# Patient Record
Sex: Male | Born: 1945 | Race: Black or African American | Hispanic: No | Marital: Married | Smoking: Never smoker
Health system: Southern US, Community
[De-identification: ages and names within clinical notes are randomized; demographics above are authoritative.]

## PROBLEM LIST (undated history)

## (undated) DIAGNOSIS — E291 Testicular hypofunction: Secondary | ICD-10-CM

## (undated) DIAGNOSIS — E785 Hyperlipidemia, unspecified: Secondary | ICD-10-CM

## (undated) DIAGNOSIS — G4733 Obstructive sleep apnea (adult) (pediatric): Secondary | ICD-10-CM

## (undated) DIAGNOSIS — Z95818 Presence of other cardiac implants and grafts: Secondary | ICD-10-CM

## (undated) DIAGNOSIS — N529 Male erectile dysfunction, unspecified: Secondary | ICD-10-CM

## (undated) DIAGNOSIS — E790 Hyperuricemia without signs of inflammatory arthritis and tophaceous disease: Secondary | ICD-10-CM

## (undated) DIAGNOSIS — H60549 Acute eczematoid otitis externa, unspecified ear: Secondary | ICD-10-CM

## (undated) DIAGNOSIS — E119 Type 2 diabetes mellitus without complications: Secondary | ICD-10-CM

## (undated) DIAGNOSIS — I10 Essential (primary) hypertension: Secondary | ICD-10-CM

## (undated) DIAGNOSIS — Z9989 Dependence on other enabling machines and devices: Secondary | ICD-10-CM

## (undated) HISTORY — PX: HYPOSPADIAS CORRECTION: SHX483

---

## 2005-04-14 ENCOUNTER — Ambulatory Visit: Payer: Self-pay | Admitting: Gastroenterology

## 2012-05-24 ENCOUNTER — Ambulatory Visit: Payer: Self-pay | Admitting: Internal Medicine

## 2012-06-16 ENCOUNTER — Ambulatory Visit: Payer: Self-pay | Admitting: Internal Medicine

## 2012-07-17 ENCOUNTER — Ambulatory Visit: Payer: Self-pay | Admitting: Internal Medicine

## 2012-08-16 ENCOUNTER — Ambulatory Visit: Payer: Self-pay | Admitting: Internal Medicine

## 2013-09-19 DIAGNOSIS — E1169 Type 2 diabetes mellitus with other specified complication: Secondary | ICD-10-CM | POA: Insufficient documentation

## 2013-09-20 DIAGNOSIS — H60549 Acute eczematoid otitis externa, unspecified ear: Secondary | ICD-10-CM | POA: Insufficient documentation

## 2014-09-22 DIAGNOSIS — Z79899 Other long term (current) drug therapy: Secondary | ICD-10-CM | POA: Insufficient documentation

## 2015-03-30 DIAGNOSIS — Z6830 Body mass index (BMI) 30.0-30.9, adult: Secondary | ICD-10-CM | POA: Insufficient documentation

## 2015-05-29 ENCOUNTER — Encounter: Payer: Self-pay | Admitting: *Deleted

## 2015-06-01 ENCOUNTER — Encounter
Admission: RE | Disposition: A | Discharge status: Home / Self Care | Payer: Self-pay | Source: Ambulatory Visit | Attending: Gastroenterology

## 2015-06-01 ENCOUNTER — Ambulatory Visit: Payer: Medicare Other | Admitting: Anesthesiology

## 2015-06-01 ENCOUNTER — Encounter: Payer: Self-pay | Admitting: Anesthesiology

## 2015-06-01 ENCOUNTER — Ambulatory Visit
Admission: RE | Admit: 2015-06-01 | Discharge: 2015-06-01 | Disposition: A | Discharge status: Home / Self Care | Payer: Medicare Other | Source: Ambulatory Visit | Attending: Gastroenterology | Admitting: Gastroenterology

## 2015-06-01 DIAGNOSIS — I10 Essential (primary) hypertension: Secondary | ICD-10-CM | POA: Insufficient documentation

## 2015-06-01 DIAGNOSIS — K640 First degree hemorrhoids: Secondary | ICD-10-CM | POA: Diagnosis not present

## 2015-06-01 DIAGNOSIS — E119 Type 2 diabetes mellitus without complications: Secondary | ICD-10-CM | POA: Diagnosis not present

## 2015-06-01 DIAGNOSIS — N529 Male erectile dysfunction, unspecified: Secondary | ICD-10-CM | POA: Insufficient documentation

## 2015-06-01 DIAGNOSIS — Z888 Allergy status to other drugs, medicaments and biological substances status: Secondary | ICD-10-CM | POA: Insufficient documentation

## 2015-06-01 DIAGNOSIS — Z7982 Long term (current) use of aspirin: Secondary | ICD-10-CM | POA: Insufficient documentation

## 2015-06-01 DIAGNOSIS — E785 Hyperlipidemia, unspecified: Secondary | ICD-10-CM | POA: Diagnosis not present

## 2015-06-01 DIAGNOSIS — Z79899 Other long term (current) drug therapy: Secondary | ICD-10-CM | POA: Insufficient documentation

## 2015-06-01 DIAGNOSIS — E790 Hyperuricemia without signs of inflammatory arthritis and tophaceous disease: Secondary | ICD-10-CM | POA: Insufficient documentation

## 2015-06-01 DIAGNOSIS — K621 Rectal polyp: Secondary | ICD-10-CM | POA: Diagnosis not present

## 2015-06-01 DIAGNOSIS — G4730 Sleep apnea, unspecified: Secondary | ICD-10-CM | POA: Insufficient documentation

## 2015-06-01 DIAGNOSIS — Z794 Long term (current) use of insulin: Secondary | ICD-10-CM | POA: Insufficient documentation

## 2015-06-01 DIAGNOSIS — G2581 Restless legs syndrome: Secondary | ICD-10-CM | POA: Insufficient documentation

## 2015-06-01 DIAGNOSIS — Z1211 Encounter for screening for malignant neoplasm of colon: Secondary | ICD-10-CM | POA: Insufficient documentation

## 2015-06-01 DIAGNOSIS — D122 Benign neoplasm of ascending colon: Secondary | ICD-10-CM | POA: Insufficient documentation

## 2015-06-01 DIAGNOSIS — G4733 Obstructive sleep apnea (adult) (pediatric): Secondary | ICD-10-CM | POA: Insufficient documentation

## 2015-06-01 DIAGNOSIS — Z7984 Long term (current) use of oral hypoglycemic drugs: Secondary | ICD-10-CM | POA: Insufficient documentation

## 2015-06-01 HISTORY — DX: Hyperuricemia without signs of inflammatory arthritis and tophaceous disease: E79.0

## 2015-06-01 HISTORY — DX: Testicular hypofunction: E29.1

## 2015-06-01 HISTORY — DX: Obstructive sleep apnea (adult) (pediatric): G47.33

## 2015-06-01 HISTORY — DX: Hyperlipidemia, unspecified: E78.5

## 2015-06-01 HISTORY — DX: Dependence on other enabling machines and devices: Z99.89

## 2015-06-01 HISTORY — DX: Male erectile dysfunction, unspecified: N52.9

## 2015-06-01 HISTORY — DX: Essential (primary) hypertension: I10

## 2015-06-01 HISTORY — DX: Type 2 diabetes mellitus without complications: E11.9

## 2015-06-01 HISTORY — PX: COLONOSCOPY WITH PROPOFOL: SHX5780

## 2015-06-01 HISTORY — DX: Acute eczematoid otitis externa, unspecified ear: H60.549

## 2015-06-01 SURGERY — COLONOSCOPY WITH PROPOFOL
Anesthesia: General

## 2015-06-01 MED ORDER — PROPOFOL 10 MG/ML IV BOLUS
INTRAVENOUS | Status: DC | PRN
  Administered 2015-06-01: 30 mg via INTRAVENOUS
  Administered 2015-06-01: 80 mg via INTRAVENOUS

## 2015-06-01 MED ORDER — PROPOFOL 500 MG/50ML IV EMUL
INTRAVENOUS | Status: DC | PRN
  Administered 2015-06-01: 120 ug/kg/min via INTRAVENOUS

## 2015-06-01 MED ORDER — SODIUM CHLORIDE 0.9 % IV SOLN
INTRAVENOUS | Status: DC
  Administered 2015-06-01: 1000 mL via INTRAVENOUS

## 2015-06-01 NOTE — Anesthesia Postprocedure Evaluation (Signed)
Anesthesia Post Note  Patient: Kenneth Conrad  Procedure(s) Performed: Procedure(s) (LRB): COLONOSCOPY WITH PROPOFOL (N/A)  Patient location during evaluation: Endoscopy Anesthesia Type: General Level of consciousness: awake and alert Pain management: pain level controlled Vital Signs Assessment: post-procedure vital signs reviewed and stable Respiratory status: spontaneous breathing, nonlabored ventilation, respiratory function stable and patient connected to nasal cannula oxygen Cardiovascular status: blood pressure returned to baseline and stable Postop Assessment: no signs of nausea or vomiting Anesthetic complications: no    Last Vitals:  Filed Vitals:   06/01/15 1002 06/01/15 1013  BP: 130/73 131/76  Pulse: 57 53  Temp:    Resp: 15 14    Last Pain: There were no vitals filed for this visit.               Kenneth Conrad

## 2015-06-01 NOTE — Discharge Instructions (Signed)

## 2015-06-01 NOTE — Anesthesia Preprocedure Evaluation (Signed)
Anesthesia Evaluation  Patient identified by MRN, date of birth, ID band Patient awake    Reviewed: Allergy & Precautions, H&P , NPO status , Patient's Chart, lab work & pertinent test results  History of Anesthesia Complications Negative for: history of anesthetic complications  Airway Mallampati: II  TM Distance: >3 FB Neck ROM: limited    Dental  (+) Poor Dentition, Chipped   Pulmonary neg shortness of breath, sleep apnea and Continuous Positive Airway Pressure Ventilation ,    Pulmonary exam normal breath sounds clear to auscultation       Cardiovascular Exercise Tolerance: Good hypertension, (-) angina(-) Past MI and (-) DOE Normal cardiovascular exam Rhythm:regular Rate:Normal     Neuro/Psych negative neurological ROS  negative psych ROS   GI/Hepatic negative GI ROS, Neg liver ROS, neg GERD  ,  Endo/Other  diabetes, Type 2, Oral Hypoglycemic Agents  Renal/GU negative Renal ROS  negative genitourinary   Musculoskeletal   Abdominal   Peds  Hematology negative hematology ROS (+)   Anesthesia Other Findings Past Medical History:   Diabetes mellitus without complication (HCC)                 Eczema of external ear                                       ED (erectile dysfunction)                                    Hypertension                                                 Hyperlipidemia                                               Hyperuricemia                                                Hypogonadism in male                                         OSA on CPAP                                                 Past Surgical History:   HYPOSPADIAS CORRECTION                                       BMI    Body Mass Index   30.06 kg/m 2      Reproductive/Obstetrics negative OB ROS  Anesthesia Physical Anesthesia Plan  ASA: III  Anesthesia Plan: General    Post-op Pain Management:    Induction:   Airway Management Planned:   Additional Equipment:   Intra-op Plan:   Post-operative Plan:   Informed Consent: I have reviewed the patients History and Physical, chart, labs and discussed the procedure including the risks, benefits and alternatives for the proposed anesthesia with the patient or authorized representative who has indicated his/her understanding and acceptance.   Dental Advisory Given  Plan Discussed with: Anesthesiologist, CRNA and Surgeon  Anesthesia Plan Comments:         Anesthesia Quick Evaluation

## 2015-06-01 NOTE — Op Note (Signed)
[facility] Gastroenterology Patient Name: Kenneth Conrad Procedure Date: 06/01/2015 9:10 AM MRN: [id number] Account #: [id number] Date of Birth: 1945-07-13 Admit Type: Outpatient Age: 70 Room: [facility] ENDO ROOM 2 Gender: Male Note Status: Finalized Procedure:         Colonoscopy Indications:       Screening for colorectal malignant neoplasm, Last                     colonoscopy 10 years ago Patient Profile:   This is a 70 year old male. Providers:         Gerrit Heck. Rayann Heman, MD Referring MD:      Christena Flake. Raechel Ache, MD (Referring MD) Medicines:         Propofol per Anesthesia Complications:     No immediate complications. Procedure:         Pre-Anesthesia Assessment:                    - Prior to the procedure, a History and Physical was                     performed, and patient medications, allergies and                     sensitivities were reviewed. The patient's tolerance of                     previous anesthesia was reviewed.                    After obtaining informed consent, the colonoscope was                     passed under direct vision. Throughout the procedure, the                     patient's blood pressure, pulse, and oxygen saturations                     were monitored continuously. The Colonoscope was                     introduced through the anus and advanced to the the cecum,                     identified by appendiceal orifice and ileocecal valve. The                     colonoscopy was performed without difficulty. The patient                     tolerated the procedure well. The quality of the bowel                     preparation was excellent. Findings:      The perianal and digital rectal examinations were normal.      A 5 mm polyp was found in the ascending colon. The polyp was sessile.       The polyp was removed with a cold snare. Resection and retrieval were       complete.      A 4 mm polyp was found in the hepatic flexure. The polyp  was sessile.       The polyp was removed with a cold snare. Resection and retrieval were  complete.      A 2 mm polyp was found in the rectum. The polyp was sessile. The polyp       was removed with a jumbo cold forceps. Resection and retrieval were       complete.      Internal hemorrhoids were found during retroflexion. The hemorrhoids       were Grade I (internal hemorrhoids that do not prolapse). Impression:        - One 5 mm polyp in the ascending colon, removed with a                     cold snare. Resected and retrieved.                    - One 4 mm polyp at the hepatic flexure, removed with a                     cold snare. Resected and retrieved.                    - One 2 mm polyp in the rectum, removed with a jumbo cold                     forceps. Resected and retrieved.                    - Internal hemorrhoids. Recommendation:    - Observe patient in GI recovery unit.                    - High fiber diet.                    - Continue present medications.                    - Await pathology results.                    - Repeat colonoscopy for surveillance based on pathology                     results.                    - Return to referring physician.                    - The findings and recommendations were discussed with the                     patient.                    - The findings and recommendations were discussed with the                     patient's family. Procedure Code(s): --- Professional ---                    [id number], Colonoscopy, flexible; with removal of tumor(s),                     polyp(s), or other lesion(s) by snare technique                    [id number], 30, Colonoscopy, flexible; with biopsy, single or  multiple Diagnosis Code(s): --- Professional ---                    Z12.11, Encounter for screening for malignant neoplasm of                     colon                    D12.2, Benign neoplasm of ascending colon                     D12.3, Benign neoplasm of transverse colon (hepatic                     flexure or splenic flexure)                    K62.1, Rectal polyp                    K64.0, First degree hemorrhoids CPT copyright 2016 [employer]. All rights reserved. The codes documented in this report are preliminary and upon coder review may  be revised to meet current compliance requirements. Mellody Life, MD 06/01/2015 9:43:46 AM This report has been signed electronically. Number of Addenda: 0 Note Initiated On: 06/01/2015 9:10 AM Scope Withdrawal Time: 0 hours 17 minutes 59 seconds  Total Procedure Duration: 0 hours 21 minutes 8 seconds       [facility]

## 2015-06-01 NOTE — Transfer of Care (Signed)
Immediate Anesthesia Transfer of Care Note  Patient: Kenneth Conrad  Procedure(s) Performed: Procedure(s): COLONOSCOPY WITH PROPOFOL (N/A)  Patient Location: Endoscopy Unit  Anesthesia Type:General  Level of Consciousness: sedated  Airway & Oxygen Therapy: Patient Spontanous Breathing and Patient connected to nasal cannula oxygen  Post-op Assessment: Report given to RN and Post -op Vital signs reviewed and stable  Post vital signs: Reviewed and stable  Last Vitals:  Filed Vitals:   06/01/15 0854  BP: 153/80  Pulse: 62  Temp: 37 C  Resp: 16    Complications: No apparent anesthesia complications

## 2015-06-01 NOTE — H&P (Signed)
  Primary Care Physician:  Ezequiel Kayser, MD  Pre-Procedure History & Physical: HPI:  Kenneth Conrad is a 70 y.o. male is here for an colonoscopy.   Past Medical History  Diagnosis Date  . Diabetes mellitus without complication ([place])   . Eczema of external ear   . ED (erectile dysfunction)   . Hypertension   . Hyperlipidemia   . Hyperuricemia   . Hypogonadism in male   . OSA on CPAP     Past Surgical History  Procedure Laterality Date  . Hypospadias correction      Prior to Admission medications   Medication Sig Start Date End Date Taking? Authorizing Provider  amLODipine (NORVASC) 2.5 MG tablet Take 2.5 mg by mouth daily.   Yes Historical Provider, MD  aspirin 81 MG tablet Take 81 mg by mouth daily.   Yes Historical Provider, MD  ergocalciferol (VITAMIN D2) 50000 units capsule Take 50,000 Units by mouth every 30 (thirty) days.   Yes Historical Provider, MD  losartan (COZAAR) 100 MG tablet Take 100 mg by mouth daily.   Yes Historical Provider, MD  metFORMIN (GLUCOPHAGE) 500 MG tablet Take by mouth 2 (two) times daily with a meal.   Yes Historical Provider, MD  triamcinolone cream (KENALOG) 0.5 % Apply 1 application topically 2 (two) times daily as needed.   Yes Historical Provider, MD    Allergies as of 05/29/2015 - Review Complete 05/29/2015  Allergen Reaction Noted  . Hctz [hydrochlorothiazide]  05/29/2015  . Hydralazine  05/29/2015  . Reserpine  05/29/2015    History reviewed. No pertinent family history.  Social History   Social History  . Marital Status: Married    Spouse Name: N/A  . Number of Children: N/A  . Years of Education: N/A   Occupational History  . Not on file.   Social History Main Topics  . Smoking status: Not on file  . Smokeless tobacco: Not on file  . Alcohol Use: Not on file  . Drug Use: Not on file  . Sexual Activity: Not on file   Other Topics Concern  . Not on file   Social History Narrative     Physical Exam: BP 153/80 mmHg   Pulse 62  Temp(Src) 98.6 F (37 C) (Tympanic)  Resp 16  Ht 5\' 7"  (1.702 m)  Wt 87.091 kg (192 lb)  BMI 30.06 kg/m2  SpO2 100% General:   Alert,  pleasant and cooperative in NAD Head:  Normocephalic and atraumatic. Neck:  Supple; no masses or thyromegaly. Lungs:  Clear throughout to auscultation.    Heart:  Regular rate and rhythm. Abdomen:  Soft, nontender and nondistended. Normal bowel sounds, without guarding, and without rebound.   Neurologic:  Alert and  oriented x4;  grossly normal neurologically.  Impression/Plan: [name] is here for an colonoscopy to be performed for screening  Risks, benefits, limitations, and alternatives regarding  colonoscopy have been reviewed with the patient.  Questions have been answered.  All parties agreeable.   Josefine Class, MD  06/01/2015, 9:09 AM

## 2015-06-02 LAB — SURGICAL PATHOLOGY

## 2015-06-03 ENCOUNTER — Encounter: Payer: Self-pay | Admitting: Gastroenterology

## 2015-09-23 DIAGNOSIS — Z860101 Personal history of adenomatous and serrated colon polyps: Secondary | ICD-10-CM | POA: Insufficient documentation

## 2016-03-18 DIAGNOSIS — Z91018 Allergy to other foods: Secondary | ICD-10-CM | POA: Insufficient documentation

## 2017-10-02 DIAGNOSIS — G2581 Restless legs syndrome: Secondary | ICD-10-CM | POA: Insufficient documentation

## 2017-10-05 ENCOUNTER — Other Ambulatory Visit: Payer: Self-pay | Admitting: Internal Medicine

## 2017-10-05 DIAGNOSIS — M5412 Radiculopathy, cervical region: Secondary | ICD-10-CM

## 2017-10-11 ENCOUNTER — Ambulatory Visit
Admission: RE | Admit: 2017-10-11 | Discharge: 2017-10-11 | Disposition: A | Discharge status: Home / Self Care | Payer: Medicare Other | Source: Ambulatory Visit | Attending: Internal Medicine | Admitting: Internal Medicine

## 2017-10-11 DIAGNOSIS — M2578 Osteophyte, vertebrae: Secondary | ICD-10-CM | POA: Insufficient documentation

## 2017-10-11 DIAGNOSIS — M4722 Other spondylosis with radiculopathy, cervical region: Secondary | ICD-10-CM | POA: Insufficient documentation

## 2017-10-11 DIAGNOSIS — M5412 Radiculopathy, cervical region: Secondary | ICD-10-CM | POA: Diagnosis present

## 2017-10-11 DIAGNOSIS — M4802 Spinal stenosis, cervical region: Secondary | ICD-10-CM | POA: Diagnosis not present

## 2018-10-15 DIAGNOSIS — E538 Deficiency of other specified B group vitamins: Secondary | ICD-10-CM | POA: Insufficient documentation

## 2019-06-12 ENCOUNTER — Ambulatory Visit: Payer: Medicare Other | Attending: Internal Medicine

## 2019-06-12 DIAGNOSIS — Z20822 Contact with and (suspected) exposure to covid-19: Secondary | ICD-10-CM

## 2019-06-13 LAB — NOVEL CORONAVIRUS, NAA: SARS-CoV-2, NAA: NOT DETECTED

## 2019-07-29 IMAGING — MR MR CERVICAL SPINE W/O CM
5 series · 32 of 48 positions shown · non-contrast
Comparison: None.

CLINICAL DATA: Initial evaluation for left shoulder pain with
tingling in left arm for several months.

EXAM:
MRI CERVICAL SPINE WITHOUT CONTRAST
TECHNIQUE: Multiplanar, multisequence MR imaging of the cervical spine was
performed. No intravenous contrast was administered.

[Series 2: T2 · sagittal · 3.0mm · 0.56mm/px · 6 of 13 slices shown (1 of 2)]
[im 1/13]
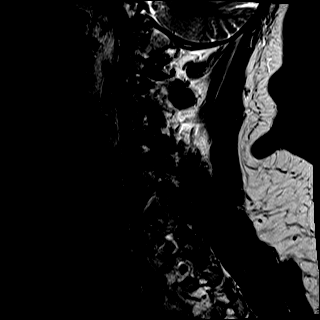
[im 3/13]
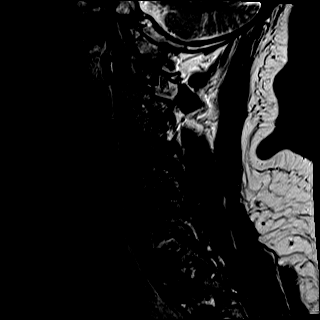
[im 5/13]
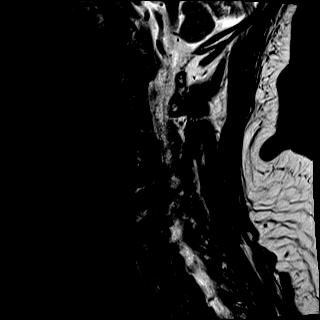
[im 8/13]
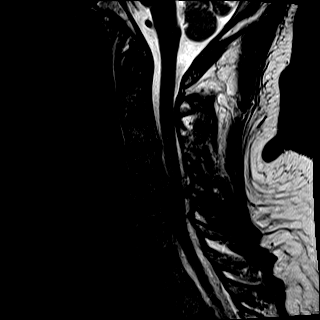
[im 10/13]
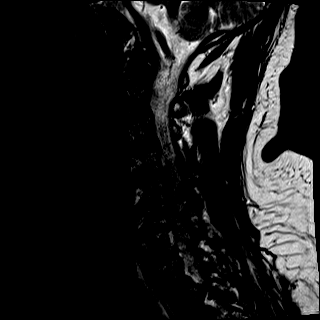
[im 13/13]
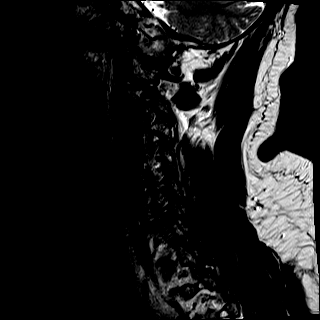

[Series 3: T1 · sagittal · 3.0mm · 0.70mm/px · 7 of 13 slices shown]
[im 1/13]
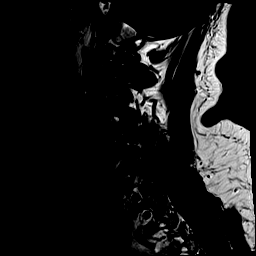
[im 3/13]
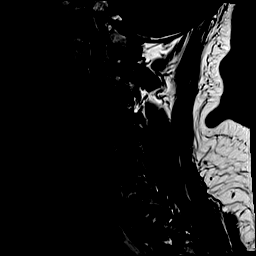
[im 5/13]
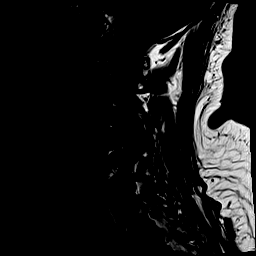
[im 7/13]
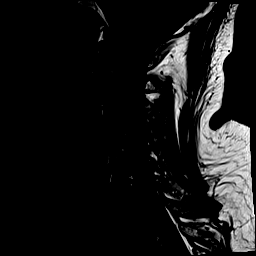
[im 9/13]
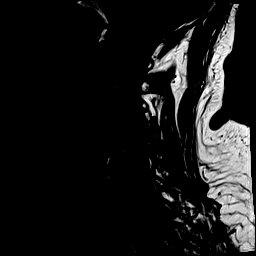
[im 11/13]
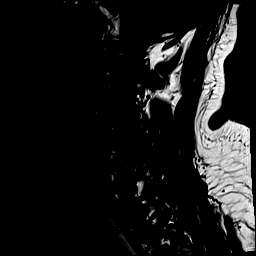
[im 13/13]
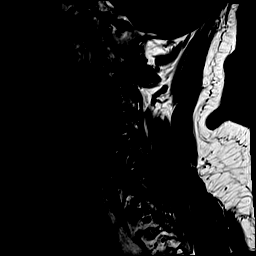

[Series 4: STIR · sagittal · 3.0mm · 0.35mm/px · 7 of 13 slices shown]
[im 1/13]
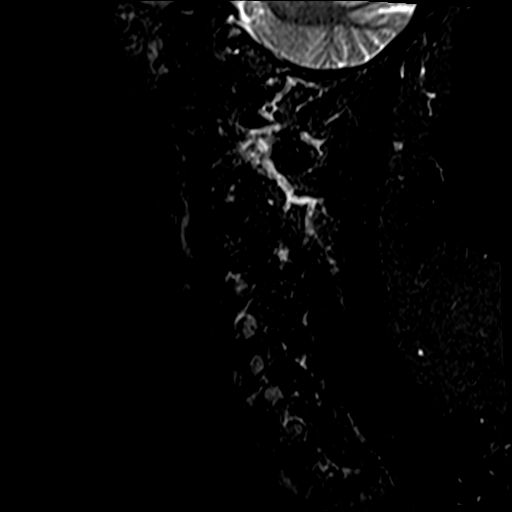
[im 3/13]
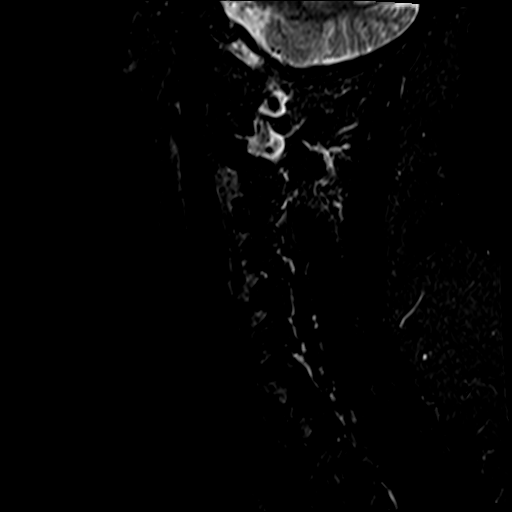
[im 5/13]
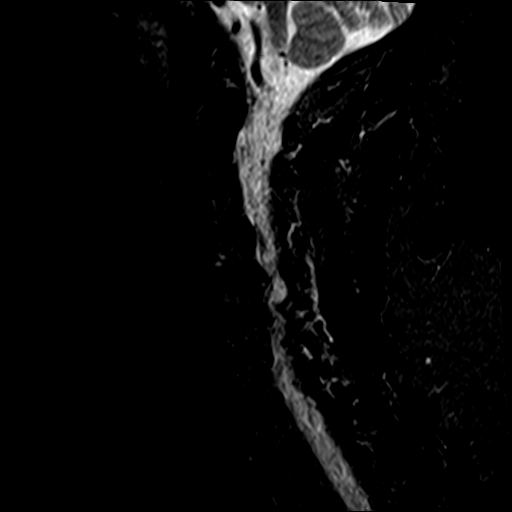
[im 7/13]
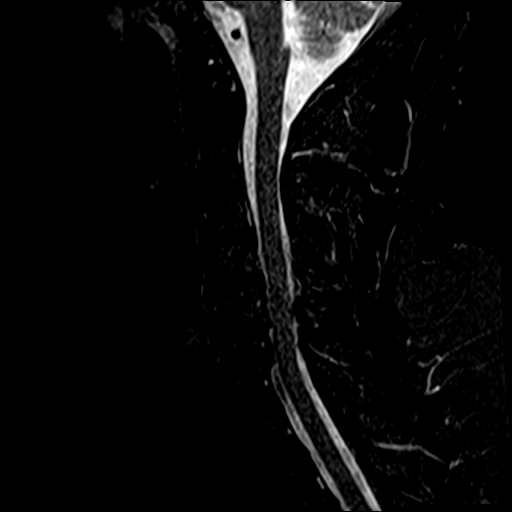
[im 9/13]
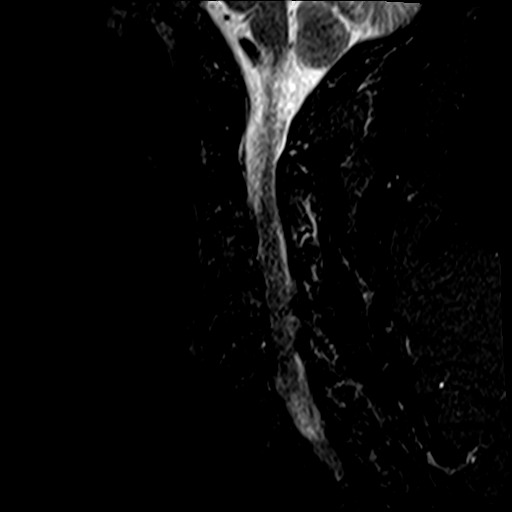
[im 11/13]
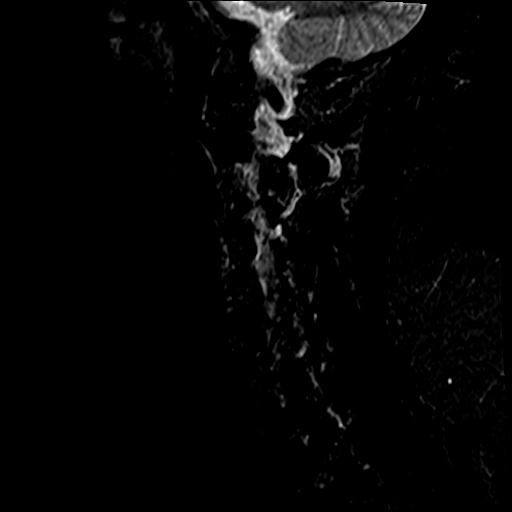
[im 13/13]
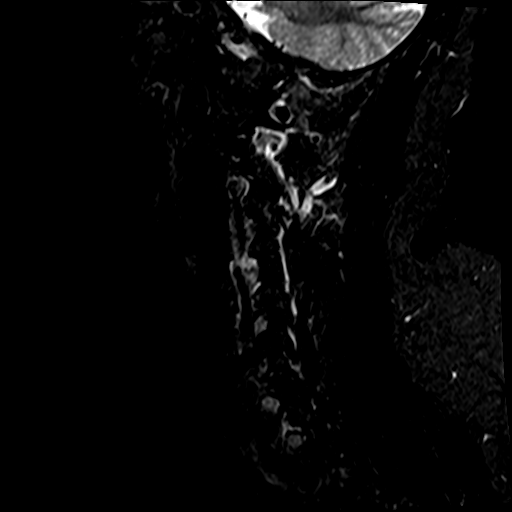

[Series 5: T2 · axial · 3.0mm · 0.62mm/px · z∈[-61,+31]mm · 8 of 26 slices shown (2 of 2)]
[im 1/26]
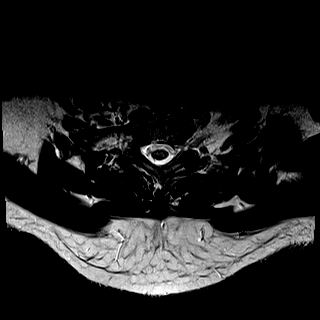
[im 4/26]
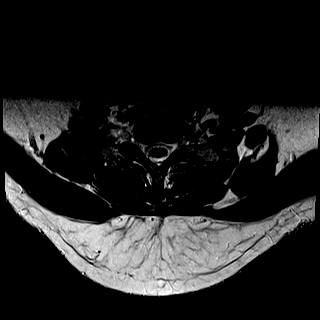
[im 8/26]
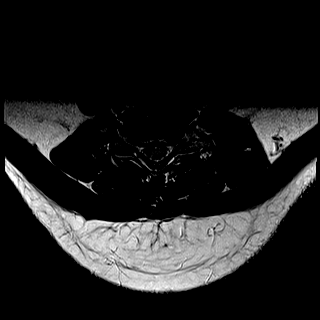
[im 12/26]
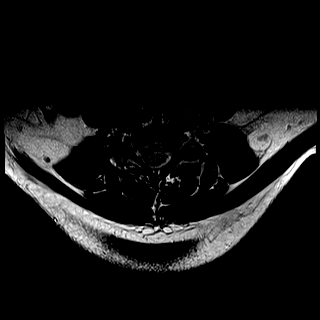
[im 14/26]
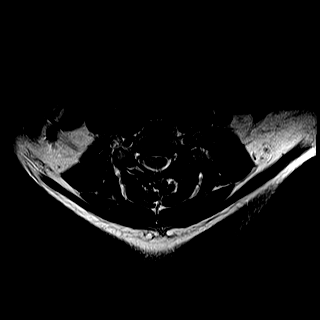
[im 18/26]
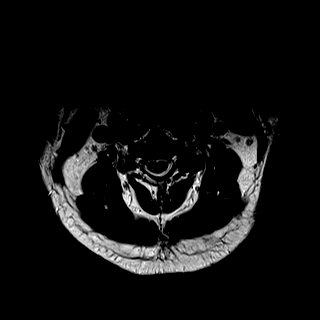
[im 22/26]
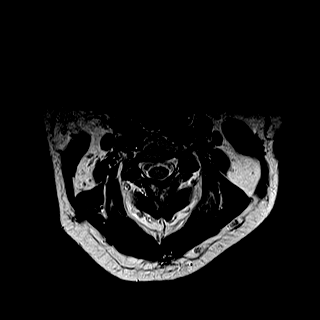
[im 26/26]
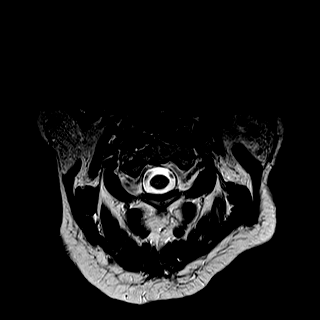

[Series 6: mpgr ax · axial · 3.0mm · 0.35mm/px · z∈[-51,-10]mm · 4 of 26 slices shown]
[im 1/26]
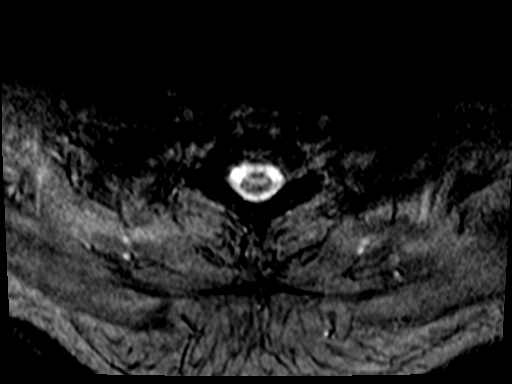
[im 4/26]
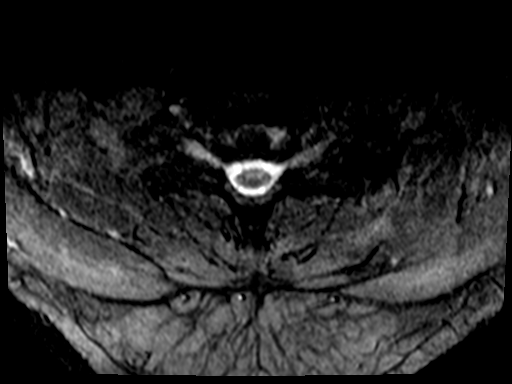
[im 8/26]
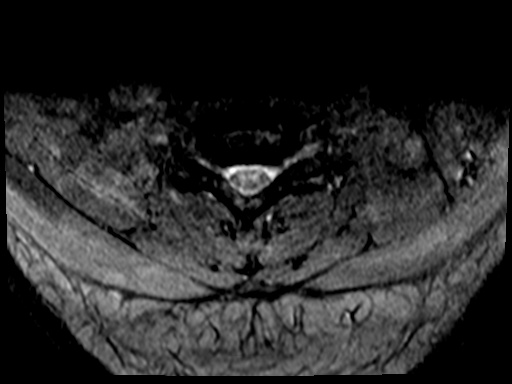
[im 12/26]
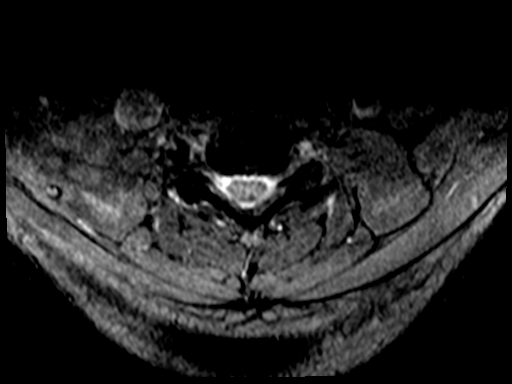

[32 of 48 positions shown; findings below may reference images not displayed]

FINDINGS: Alignment: Straightening with slight reversal of the normal cervical
lordosis. Trace anterolisthesis of C3 on C4 and C4 on C5.

Vertebrae: Vertebral body heights maintained without evidence for
acute or chronic fracture. Bone marrow signal intensity within
normal limits. No discrete or worrisome osseous lesions. No abnormal
marrow edema.

Cord: Signal intensity within the cervical spinal cord is normal.

Posterior Fossa, vertebral arteries, paraspinal tissues: Visualized
brain and posterior fossa within normal limits. Craniocervical
junction normal. Paraspinous and prevertebral soft tissues are
normal. Normal intravascular flow voids present within the vertebral
arteries bilaterally.

Disc levels:

C2-C3: Unremarkable.

C3-C4: Trace anterolisthesis. Mild diffuse disc bulge with prominent
left-sided facet degeneration. Resultant moderate to severe left C4
foraminal stenosis. Central canal remains widely patent.

C4-C5: Trace anterolisthesis. Diffuse disc bulge, asymmetric to the
left. Prominent left-sided facet degeneration. Resultant moderate to
severe left C5 foraminal stenosis. Central canal remains patent.

C5-C6: Chronic diffuse degenerative disc osteophyte with left
greater than right uncovertebral spurring. Flattening of the ventral
CSF with mild spinal stenosis. Moderate to severe left foraminal
narrowing related uncovertebral disease.

C6-C7: Mild diffuse disc bulge with uncovertebral hypertrophy.
Flattening of the ventral CSF with mild spinal stenosis. Mild to
moderate bilateral C7 foraminal narrowing.

C7-T1: Mild disc bulge. Left-sided facet hypertrophy. No stenosis.

Visualized upper thoracic spine demonstrates no significant finding.
IMPRESSION: 1. Degenerative spondylolysis with left-sided facet hypertrophy at
C3-4 through C5-6 with resultant moderate left foraminal narrowing.
The left C4, C5, or C6 nerve roots could be affected.
2. Degenerative disc osteophyte at C5-6 and C6-7 with resultant mild
spinal stenosis.

## 2020-09-21 ENCOUNTER — Encounter: Payer: Self-pay | Admitting: *Deleted

## 2020-09-22 ENCOUNTER — Ambulatory Visit: Payer: Medicare Other | Admitting: Certified Registered Nurse Anesthetist

## 2020-09-22 ENCOUNTER — Ambulatory Visit
Admission: RE | Admit: 2020-09-22 | Discharge: 2020-09-22 | Disposition: A | Discharge status: Home / Self Care | Payer: Medicare Other | Source: Ambulatory Visit | Attending: Gastroenterology | Admitting: Gastroenterology

## 2020-09-22 ENCOUNTER — Encounter
Admission: RE | Disposition: A | Discharge status: Home / Self Care | Payer: Self-pay | Source: Ambulatory Visit | Attending: Gastroenterology

## 2020-09-22 ENCOUNTER — Encounter: Payer: Self-pay | Admitting: *Deleted

## 2020-09-22 DIAGNOSIS — Z888 Allergy status to other drugs, medicaments and biological substances status: Secondary | ICD-10-CM | POA: Diagnosis not present

## 2020-09-22 DIAGNOSIS — D123 Benign neoplasm of transverse colon: Secondary | ICD-10-CM | POA: Insufficient documentation

## 2020-09-22 DIAGNOSIS — Z7984 Long term (current) use of oral hypoglycemic drugs: Secondary | ICD-10-CM | POA: Diagnosis not present

## 2020-09-22 DIAGNOSIS — Z79899 Other long term (current) drug therapy: Secondary | ICD-10-CM | POA: Insufficient documentation

## 2020-09-22 DIAGNOSIS — K640 First degree hemorrhoids: Secondary | ICD-10-CM | POA: Insufficient documentation

## 2020-09-22 DIAGNOSIS — Z1211 Encounter for screening for malignant neoplasm of colon: Secondary | ICD-10-CM | POA: Diagnosis not present

## 2020-09-22 DIAGNOSIS — I10 Essential (primary) hypertension: Secondary | ICD-10-CM | POA: Diagnosis not present

## 2020-09-22 DIAGNOSIS — Z86010 Personal history of colonic polyps: Secondary | ICD-10-CM | POA: Diagnosis present

## 2020-09-22 DIAGNOSIS — D125 Benign neoplasm of sigmoid colon: Secondary | ICD-10-CM | POA: Diagnosis not present

## 2020-09-22 DIAGNOSIS — E119 Type 2 diabetes mellitus without complications: Secondary | ICD-10-CM | POA: Diagnosis not present

## 2020-09-22 DIAGNOSIS — Z7982 Long term (current) use of aspirin: Secondary | ICD-10-CM | POA: Insufficient documentation

## 2020-09-22 HISTORY — PX: COLONOSCOPY WITH PROPOFOL: SHX5780

## 2020-09-22 LAB — GLUCOSE, CAPILLARY: Glucose-Capillary: 126 mg/dL — ABNORMAL HIGH (ref 70–99)

## 2020-09-22 SURGERY — COLONOSCOPY WITH PROPOFOL
Anesthesia: General

## 2020-09-22 MED ORDER — SODIUM CHLORIDE 0.9 % IV SOLN
INTRAVENOUS | Status: DC
  Administered 2020-09-22: 1000 mL via INTRAVENOUS

## 2020-09-22 MED ORDER — PROPOFOL 500 MG/50ML IV EMUL
INTRAVENOUS | Status: DC | PRN
  Administered 2020-09-22: 125 ug/kg/min via INTRAVENOUS

## 2020-09-22 MED ORDER — SODIUM CHLORIDE 0.9 % IV SOLN: INTRAVENOUS | Status: DC | PRN

## 2020-09-22 MED ORDER — PROPOFOL 10 MG/ML IV BOLUS
INTRAVENOUS | Status: DC | PRN
  Administered 2020-09-22: 40 mg via INTRAVENOUS
  Administered 2020-09-22: 20 mg via INTRAVENOUS

## 2020-09-22 MED ORDER — LIDOCAINE HCL (CARDIAC) PF 100 MG/5ML IV SOSY
PREFILLED_SYRINGE | INTRAVENOUS | Status: DC | PRN
  Administered 2020-09-22: 40 mg via INTRAVENOUS

## 2020-09-22 NOTE — Anesthesia Postprocedure Evaluation (Signed)
Anesthesia Post Note  Patient: Kenneth Conrad  Procedure(s) Performed: COLONOSCOPY WITH PROPOFOL (N/A )  Patient location during evaluation: Endoscopy Anesthesia Type: General Level of consciousness: awake and alert Pain management: pain level controlled Vital Signs Assessment: post-procedure vital signs reviewed and stable Respiratory status: spontaneous breathing, nonlabored ventilation, respiratory function stable and patient connected to nasal cannula oxygen Cardiovascular status: blood pressure returned to baseline and stable Postop Assessment: no apparent nausea or vomiting Anesthetic complications: no   No complications documented.   Last Vitals:  Vitals:   09/22/20 0959 09/22/20 1009  BP: (!) 144/74 (!) 153/70  Pulse:    Resp:    Temp:    SpO2:      Last Pain:  Vitals:   09/22/20 1009  TempSrc:   PainSc: 0-No pain                 Arita Miss

## 2020-09-22 NOTE — Anesthesia Preprocedure Evaluation (Signed)
Anesthesia Evaluation  Patient identified by MRN, date of birth, ID band Patient awake    Reviewed: Allergy & Precautions, NPO status , Patient's Chart, lab work & pertinent test results  History of Anesthesia Complications Negative for: history of anesthetic complications  Airway Mallampati: II  TM Distance: >3 FB Neck ROM: Full    Dental no notable dental hx. (+) Teeth Intact   Pulmonary sleep apnea and Continuous Positive Airway Pressure Ventilation , neg COPD, Patient abstained from smoking.Not current smoker,    Pulmonary exam normal breath sounds clear to auscultation       Cardiovascular Exercise Tolerance: Good METShypertension, Pt. on medications (-) CAD and (-) Past MI (-) dysrhythmias  Rhythm:Regular Rate:Normal - Systolic murmurs    Neuro/Psych negative neurological ROS  negative psych ROS   GI/Hepatic neg GERD  ,(+)     (-) substance abuse  ,   Endo/Other  diabetes  Renal/GU negative Renal ROS     Musculoskeletal   Abdominal   Peds  Hematology   Anesthesia Other Findings Past Medical History: No date: Diabetes mellitus without complication (HCC) No date: Eczema of external ear No date: ED (erectile dysfunction) No date: Hyperlipidemia No date: Hypertension No date: Hyperuricemia No date: Hypogonadism in male No date: OSA on CPAP  Reproductive/Obstetrics                             Anesthesia Physical Anesthesia Plan  ASA: II  Anesthesia Plan: General   Post-op Pain Management:    Induction: Intravenous  PONV Risk Score and Plan: 2 and Ondansetron, Propofol infusion and TIVA  Airway Management Planned: Nasal Cannula  Additional Equipment: None  Intra-op Plan:   Post-operative Plan:   Informed Consent: I have reviewed the patients History and Physical, chart, labs and discussed the procedure including the risks, benefits and alternatives for the proposed  anesthesia with the patient or authorized representative who has indicated his/her understanding and acceptance.     Dental advisory given  Plan Discussed with: CRNA and Surgeon  Anesthesia Plan Comments: (Discussed risks of anesthesia with patient, including possibility of difficulty with spontaneous ventilation under anesthesia necessitating airway intervention, PONV, and rare risks such as cardiac or respiratory or neurological events. Patient understands.)        Anesthesia Quick Evaluation

## 2020-09-22 NOTE — H&P (Signed)
Outpatient short stay form Pre-procedure 09/22/2020 9:07 AM Raylene Miyamoto MD, MPH  Primary Physician: Dr. Raechel Ache  Reason for visit:  Surveillance  History of present illness:   75 y/o gentleman with history of hypertension, DM II, and obesity here for surveillance colonoscopy. No family history of GI malignancies. No blood thinners. No abdominal surgeries. Had two small polyps on colonoscopy done in 2017 that were SSA.    Current Facility-Administered Medications:  .  0.9 %  sodium chloride infusion, , Intravenous, Continuous, Greysen Devino, Hilton Cork, MD, Last Rate: 20 mL/hr at 09/22/20 0902, 1,000 mL at 09/22/20 0902  Medications Prior to Admission  Medication Sig Dispense Refill Last Dose  . amLODipine (NORVASC) 2.5 MG tablet Take 10 mg by mouth daily.   09/21/2020 at Unknown time  . aspirin 81 MG tablet Take 81 mg by mouth daily.   Past Week at Unknown time  . ergocalciferol (VITAMIN D2) 50000 units capsule Take 50,000 Units by mouth every 30 (thirty) days.   Past Week at Unknown time  . losartan (COZAAR) 100 MG tablet Take 100 mg by mouth daily.   09/21/2020 at Unknown time  . metFORMIN (GLUCOPHAGE) 500 MG tablet Take by mouth 2 (two) times daily with a meal.   Past Week at Unknown time  . pravastatin (PRAVACHOL) 20 MG tablet Take 20 mg by mouth daily.   09/21/2020 at Unknown time  . rOPINIRole (REQUIP) 0.5 MG tablet Take 0.5 mg by mouth 3 (three) times daily.   09/21/2020 at Unknown time  . triamcinolone cream (KENALOG) 0.5 % Apply 1 application topically 2 (two) times daily as needed.   Past Week at Unknown time  . vitamin B-12 (CYANOCOBALAMIN) 1000 MCG tablet Take 1,000 mcg by mouth daily.   Past Week at Unknown time     Allergies  Allergen Reactions  . Hctz [Hydrochlorothiazide]   . Hydralazine   . Reserpine      Past Medical History:  Diagnosis Date  . Diabetes mellitus without complication ([place])   . Eczema of external ear   . ED (erectile dysfunction)   . Hyperlipidemia   .  Hypertension   . Hyperuricemia   . Hypogonadism in male   . OSA on CPAP     Review of systems:  Otherwise negative.    Physical Exam  Gen: Alert, oriented. Appears stated age.  HEENT: PERRLA. Lungs: No respiratory distress CV: RRR Abd: soft, benign, no masses Ext: No edema    Planned procedures: Proceed with colonoscopy. The patient understands the nature of the planned procedure, indications, risks, alternatives and potential complications including but not limited to bleeding, infection, perforation, damage to internal organs and possible oversedation/side effects from anesthesia. The patient agrees and gives consent to proceed.  Please refer to procedure notes for findings, recommendations and patient disposition/instructions.     Raylene Miyamoto MD, MPH Gastroenterology 09/22/2020  9:07 AM

## 2020-09-22 NOTE — Transfer of Care (Signed)
Immediate Anesthesia Transfer of Care Note  Patient: Kenneth Conrad  Procedure(s) Performed: COLONOSCOPY WITH PROPOFOL (N/A )  Patient Location: PACU  Anesthesia Type:General  Level of Consciousness: awake, alert  and oriented  Airway & Oxygen Therapy: Patient Spontanous Breathing  Post-op Assessment: Report given to RN and Post -op Vital signs reviewed and stable  Post vital signs: Reviewed and stable  Last Vitals:  Vitals Value Taken Time  BP    Temp    Pulse    Resp    SpO2      Last Pain:  Vitals:   09/22/20 0848  TempSrc: Temporal  PainSc: 0-No pain         Complications: No complications documented.

## 2020-09-22 NOTE — Op Note (Signed)
[facility] Gastroenterology Patient Name: Kenneth Conrad Procedure Date: 09/22/2020 9:00 AM MRN: [id number] Account #: [id number] Date of Birth: March 18, 1946 Admit Type: Outpatient Age: 75 Room: [facility] ENDO ROOM 2 Gender: Male Note Status: Finalized Procedure:             Colonoscopy Indications:           High risk colon cancer surveillance: Personal history                         of sessile serrated colon polyp (less than 10 mm in                         size) with no dysplasia Providers:             Andrey Farmer MD, MD Referring MD:          Christena Flake. Raechel Ache, MD (Referring MD) Medicines:             Monitored Anesthesia Care Complications:         No immediate complications. Estimated blood loss:                         Minimal. Procedure:             Pre-Anesthesia Assessment:                        - Prior to the procedure, a History and Physical was                         performed, and patient medications and allergies were                         reviewed. The patient is competent. The risks and                         benefits of the procedure and the sedation options and                         risks were discussed with the patient. All questions                         were answered and informed consent was obtained.                         Patient identification and proposed procedure were                         verified by the physician, the nurse, the anesthetist                         and the technician in the endoscopy suite. Mental                         Status Examination: alert and oriented. Airway                         Examination: normal oropharyngeal airway and neck  mobility. Respiratory Examination: clear to                         auscultation. CV Examination: normal. Prophylactic                         Antibiotics: The patient does not require prophylactic                         antibiotics. Prior  Anticoagulants: The patient has                         taken no previous anticoagulant or antiplatelet                         agents. ASA Grade Assessment: II - A patient with mild                         systemic disease. After reviewing the risks and                         benefits, the patient was deemed in satisfactory                         condition to undergo the procedure. The anesthesia                         plan was to use monitored anesthesia care (MAC).                         Immediately prior to administration of medications,                         the patient was re-assessed for adequacy to receive                         sedatives. The heart rate, respiratory rate, oxygen                         saturations, blood pressure, adequacy of pulmonary                         ventilation, and response to care were monitored                         throughout the procedure. The physical status of the                         patient was re-assessed after the procedure.                        After obtaining informed consent, the colonoscope was                         passed under direct vision. Throughout the procedure,                         the patient's blood pressure, pulse, and oxygen  saturations were monitored continuously. The                         Colonoscope was introduced through the anus and                         advanced to the the cecum, identified by appendiceal                         orifice and ileocecal valve. The colonoscopy was                         performed without difficulty. The patient tolerated                         the procedure well. The quality of the bowel                         preparation was good. Findings:      The perianal and digital rectal examinations were normal.      A 3 mm polyp was found in the proximal transverse colon. The polyp was       sessile. The polyp was removed with a cold snare. Resection  and       retrieval were complete. Estimated blood loss was minimal.      A 4 mm polyp was found in the distal transverse colon. The polyp was       sessile. The polyp was removed with a cold snare. Resection and       retrieval were complete. Estimated blood loss was minimal.      A 2 mm polyp was found in the sigmoid colon. The polyp was sessile. The       polyp was removed with a cold snare. Resection and retrieval were       complete. Estimated blood loss was minimal.      Internal hemorrhoids were found during retroflexion. The hemorrhoids       were Grade I (internal hemorrhoids that do not prolapse).      The exam was otherwise without abnormality on direct and retroflexion       views. Impression:            - One 3 mm polyp in the proximal transverse colon,                         removed with a cold snare. Resected and retrieved.                        - One 4 mm polyp in the distal transverse colon,                         removed with a cold snare. Resected and retrieved.                        - One 2 mm polyp in the sigmoid colon, removed with a                         cold snare. Resected and retrieved.                        -  Internal hemorrhoids.                        - The examination was otherwise normal on direct and                         retroflexion views. Recommendation:        - Discharge patient to home.                        - Resume previous diet.                        - Continue present medications.                        - Await pathology results.                        - Repeat colonoscopy for surveillance based on                         pathology results.                        - Return to referring physician as previously                         scheduled. Procedure Code(s):     --- Professional ---                        [phone], Colonoscopy, flexible; with removal of                         tumor(s), polyp(s), or other lesion(s) by snare                          technique Diagnosis Code(s):     --- Professional ---                        Z86.010, Personal history of colonic polyps                        K63.5, Polyp of colon                        K64.0, First degree hemorrhoids CPT copyright 2019 [employer]. All rights reserved. The codes documented in this report are preliminary and upon coder review may  be revised to meet current compliance requirements. Andrey Farmer MD, MD 09/22/2020 9:38:25 AM Number of Addenda: 0 Note Initiated On: 09/22/2020 9:00 AM Scope Withdrawal Time: 0 hours 13 minutes 22 seconds  Total Procedure Duration: 0 hours 18 minutes 27 seconds  Estimated Blood Loss:  Estimated blood loss was minimal.      [facility]

## 2020-09-22 NOTE — Interval H&P Note (Signed)
History and Physical Interval Note:  09/22/2020 9:09 AM  Kenneth Conrad  has presented today for surgery, with the diagnosis of PERSONAL HX.OF COLON POLYPS.  The various methods of treatment have been discussed with the patient and family. After consideration of risks, benefits and other options for treatment, the patient has consented to  Procedure(s): COLONOSCOPY WITH PROPOFOL (N/A) as a surgical intervention.  The patient's history has been reviewed, patient examined, no change in status, stable for surgery.  I have reviewed the patient's chart and labs.  Questions were answered to the patient's satisfaction.     Lesly Rubenstein  Ok to proceed with colonoscopy

## 2020-09-23 ENCOUNTER — Encounter: Payer: Self-pay | Admitting: Gastroenterology

## 2020-09-23 LAB — SURGICAL PATHOLOGY

## 2023-02-06 ENCOUNTER — Ambulatory Visit (INDEPENDENT_AMBULATORY_CARE_PROVIDER_SITE_OTHER): Payer: Medicare Other

## 2023-02-06 ENCOUNTER — Ambulatory Visit
Admission: RE | Admit: 2023-02-06 | Discharge: 2023-02-06 | Disposition: A | Discharge status: Home / Self Care | Payer: Medicare Other | Source: Ambulatory Visit

## 2023-02-06 VITALS — BP 135/79 | HR 70 | Temp 98.8°F | Resp 16 | Ht 67.0 in | Wt 192.0 lb

## 2023-02-06 DIAGNOSIS — G4733 Obstructive sleep apnea (adult) (pediatric): Secondary | ICD-10-CM | POA: Insufficient documentation

## 2023-02-06 DIAGNOSIS — L03012 Cellulitis of left finger: Secondary | ICD-10-CM

## 2023-02-06 DIAGNOSIS — M79645 Pain in left finger(s): Secondary | ICD-10-CM | POA: Diagnosis not present

## 2023-02-06 DIAGNOSIS — I152 Hypertension secondary to endocrine disorders: Secondary | ICD-10-CM | POA: Insufficient documentation

## 2023-02-06 DIAGNOSIS — S6010XA Contusion of unspecified finger with damage to nail, initial encounter: Secondary | ICD-10-CM | POA: Diagnosis not present

## 2023-02-06 DIAGNOSIS — S62525A Nondisplaced fracture of distal phalanx of left thumb, initial encounter for closed fracture: Secondary | ICD-10-CM

## 2023-02-06 DIAGNOSIS — N529 Male erectile dysfunction, unspecified: Secondary | ICD-10-CM | POA: Insufficient documentation

## 2023-02-06 DIAGNOSIS — E291 Testicular hypofunction: Secondary | ICD-10-CM | POA: Insufficient documentation

## 2023-02-06 DIAGNOSIS — E1169 Type 2 diabetes mellitus with other specified complication: Secondary | ICD-10-CM | POA: Insufficient documentation

## 2023-02-06 DIAGNOSIS — E790 Hyperuricemia without signs of inflammatory arthritis and tophaceous disease: Secondary | ICD-10-CM | POA: Insufficient documentation

## 2023-02-06 DIAGNOSIS — E559 Vitamin D deficiency, unspecified: Secondary | ICD-10-CM | POA: Insufficient documentation

## 2023-02-06 MED ORDER — CEPHALEXIN 500 MG PO CAPS: 500.0000 mg | ORAL_CAPSULE | Freq: Four times a day (QID) | ORAL | 0 refills | Status: AC

## 2023-02-06 NOTE — Discharge Instructions (Signed)
-  You have a fracture at the tip of your thumb. - We gave you a splint.  I would like you to wear that the majority of the time.  You may take it off to clean your hand/shower and ice your hand.  Try to ice your thumb every 2 hours and keep it elevated. - You may take Tylenol or Motrin for pain relief. - The increased redness and swelling could be related to an infection that is developing.  I sent an antibiotic to the pharmacy.  Make sure you take full course of antibiotic.  If at any point you notice increased swelling, increased redness, fever, worsening pain, increased difficulty moving her thumb you should go to the emergency department. - Try to follow-up with orthopedics as soon as possible.  You have a condition requiring you to follow up with Orthopedics so please call one of the following office for appointment:   [facility] Address: [address] [place], [place] [address] Phone: [phone]  [facility] [address], [place], [place] [address] Phone: [phone]  [facility] [address] [place], [place] [address] Phone: [phone]

## 2023-02-06 NOTE — ED Provider Notes (Signed)
[facility]    CSN: [id number] Arrival date & time: 02/06/23  1339      History   Chief Complaint Chief Complaint  Patient presents with   Finger Injury    Appt    HPI Kenneth Conrad is a 77 y.o. male with history of diabetes, hypertension and restless leg syndrome.  He presents today for left thumb pain, swelling x 5 days.  Patient reports he slammed it in his truck door.  He reports it has become more swollen and painful and developed redness of the skin x 2 days.  He reports that the entire thumbnail has a large area of discoloration/bruising.  He denies ever having any bleeding from underneath the nail.  No cuts or open wounds.  He has not really been treating condition at home.  He has continued to use his left hand.  He is right-handed.  He denies any previous fractures.  He reports a significant skin avulsion of the affected thumb when he was a child.  He has not had any fevers.  He reports reduced range of motion.  No numbness or weakness.  HPI  Past Medical History:  Diagnosis Date   Diabetes mellitus without complication (HCC)    Eczema of external ear    ED (erectile dysfunction)    Hyperlipidemia    Hypertension    Hyperuricemia    Hypogonadism in male    OSA on CPAP     Patient Active Problem List   Diagnosis Date Noted   ED (erectile dysfunction) 02/06/2023   Hyperlipidemia associated with type 2 diabetes mellitus (HCC) 02/06/2023   Hypertension associated with type 2 diabetes mellitus (HCC) 02/06/2023   Hyperuricemia 02/06/2023   Hypogonadism in male 02/06/2023   OSA on CPAP 02/06/2023   Vitamin D deficiency 02/06/2023   B12 deficiency 10/15/2018   Restless legs syndrome 10/02/2017   Hx of food allergy 03/18/2016   History of adenomatous polyp of colon 09/23/2015   Body mass index (BMI) of 30.0-30.9 in adult 03/30/2015   High risk medication use 09/22/2014   Eczema of external ear 09/20/2013   Type 2 diabetes mellitus with other specified  complication (HCC) 09/19/2013    Past Surgical History:  Procedure Laterality Date   COLONOSCOPY WITH PROPOFOL N/A 06/01/2015   Procedure: COLONOSCOPY WITH PROPOFOL;  Surgeon: Elnita Maxwell, MD;  Location: [facility] ENDOSCOPY;  Service: Endoscopy;  Laterality: N/A;   COLONOSCOPY WITH PROPOFOL N/A 09/22/2020   Procedure: COLONOSCOPY WITH PROPOFOL;  Surgeon: Regis Bill, MD;  Location: [facility] ENDOSCOPY;  Service: Endoscopy;  Laterality: N/A;   HYPOSPADIAS CORRECTION         Home Medications    Prior to Admission medications   Medication Sig Start Date End Date Taking? Authorizing Provider  amLODipine (NORVASC) 2.5 MG tablet Take 10 mg by mouth daily.   Yes [provider]  aspirin 81 MG tablet Take 81 mg by mouth daily.   Yes [provider]  cephALEXin (KEFLEX) 500 MG capsule Take 1 capsule (500 mg total) by mouth 4 (four) times daily for 7 days. 02/06/23 02/13/23 Yes Eusebio Friendly B, PA-C  dapagliflozin propanediol (FARXIGA) 10 MG TABS tablet Take 1 tablet by mouth daily. 12/31/22 12/31/23 Yes [provider]  ergocalciferol (VITAMIN D2) 50000 units capsule Take 50,000 Units by mouth every 30 (thirty) days.   Yes [provider]  losartan (COZAAR) 100 MG tablet Take 100 mg by mouth daily.   Yes [provider]  metFORMIN (  GLUCOPHAGE) 500 MG tablet Take by mouth 2 (two) times daily with a meal.   Yes [provider]  pravastatin (PRAVACHOL) 20 MG tablet Take 20 mg by mouth daily.   Yes [provider]  rOPINIRole (REQUIP) 0.5 MG tablet Take 0.5 mg by mouth 3 (three) times daily.   Yes [provider]  triamcinolone cream (KENALOG) 0.5 % Apply 1 application topically 2 (two) times daily as needed.   Yes [provider]  vitamin B-12 (CYANOCOBALAMIN) 1000 MCG tablet Take 1,000 mcg by mouth daily.   Yes [provider]    Family History History reviewed. No pertinent family history.  Social  History Social History   Tobacco Use   Smoking status: Never   Smokeless tobacco: Never  Vaping Use   Vaping status: Never Used  Substance Use Topics   Alcohol use: Yes    Comment: occas   Drug use: Never     Allergies   Hctz [hydrochlorothiazide], Hydralazine, and Reserpine   Review of Systems Review of Systems  Constitutional:  Negative for fatigue and fever.  Musculoskeletal:  Positive for arthralgias and joint swelling.  Skin:  Positive for color change. Negative for wound.  Neurological:  Negative for weakness and numbness.     Physical Exam Triage Vital Signs ED Triage Vitals  Encounter Vitals Group     BP 02/06/23 1416 135/79     Systolic BP Percentile --      Diastolic BP Percentile --      Pulse Rate 02/06/23 1416 70     Resp 02/06/23 1416 16     Temp 02/06/23 1416 98.8 F (37.1 C)     Temp Source 02/06/23 1416 Oral     SpO2 02/06/23 1416 96 %     Weight 02/06/23 1416 192 lb (87.1 kg)     Height 02/06/23 1416 5\' 7"  (1.702 m)     Head Circumference --      Peak Flow --      Pain Score 02/06/23 1419 0     Pain Loc --      Pain Education --      Exclude from Growth Chart --    No data found.  Updated Vital Signs BP 135/79 (BP Location: Left Arm)   Pulse 70   Temp 98.8 F (37.1 C) (Oral)   Resp 16   Ht 5\' 7"  (1.702 m)   Wt 192 lb (87.1 kg)   SpO2 96%   BMI 30.07 kg/m   Physical Exam Vitals and nursing note reviewed.  Constitutional:      General: He is not in acute distress.    Appearance: Normal appearance. He is well-developed. He is not ill-appearing.  HENT:     Head: Normocephalic and atraumatic.  Eyes:     General: No scleral icterus.    Conjunctiva/sclera: Conjunctivae normal.  Cardiovascular:     Rate and Rhythm: Normal rate.     Pulses: Normal pulses.  Pulmonary:     Effort: Pulmonary effort is normal. No respiratory distress.  Musculoskeletal:        General: Swelling and tenderness present.     Cervical back: Neck supple.      Comments: LEFT THUMB: See image included in chart.  There is subungual hematoma of approximately 90% of nail.  Tenderness of the IP joint and distal phalanx.  Increased swelling and erythema of the entire left thumb extending slightly to the dorsal left hand.  Good pulses and strength.  Skin:    General: Skin is warm and dry.     Capillary Refill: Capillary refill takes less than 2 seconds.  Neurological:     General: No focal deficit present.     Mental Status: He is alert. Mental status is at baseline.     Motor: No weakness.     Gait: Gait normal.  Psychiatric:        Mood and Affect: Mood normal.        Behavior: Behavior normal.      UC Treatments / Results  Labs (all labs ordered are listed, but only abnormal results are displayed) Labs Reviewed - No data to display  EKG   Radiology No results found.  Procedures Procedures (including critical care time)  Medications Ordered in UC Medications - No data to display  Initial Impression / Assessment and Plan / UC Course  I have reviewed the triage vital signs and the nursing notes.  Pertinent labs & imaging results that were available during my care of the patient were reviewed by me and considered in my medical decision making (see chart for details).   77 year old male presents for left thumb pain.  Patient smashed his thumb in his truck door 5 days ago.  Started to have increased swelling and redness of the thumb over the past 2 days.  See image included in chart.  Patient has significant area of erythema, swelling, increased warmth of skin of thumb and a large subungual hematoma.  Tenderness of the IP joint and distal phalanx.  X-ray of thumb obtained shows fracture of distal phalanx.  Concern for cellulitis complicating this fracture.  Also concern for hemarthrosis related to the fracture.  Will treat at this time with Keflex and encouraged him to use ibuprofen and frequent cryotherapy.  Advised him to contact  orthopedics.  Information provided to patient.  He was placed in a thumb spica.  Fracture care guidelines discussed with patient.  ED precautions given.  Over read states chronic changes but no acute abnormality however clinical presentation is consistent with a avulsion fracture.  Patient has what looks to be an avulsion fracture on the lateral view and is tenderness specifically in this area.   Final Clinical Impressions(s) / UC Diagnoses   Final diagnoses:  Nondisplaced fracture of distal phalanx of left thumb, initial encounter for closed fracture  Subungual hematoma of digit of hand, initial encounter  Cellulitis of left thumb     Discharge Instructions      -You have a fracture at the tip of your thumb. - We gave you a splint.  I would like you to wear that the majority of the time.  You may take it off to clean your hand/shower and ice your hand.  Try to ice your thumb every 2 hours and keep it elevated. - You may take Tylenol or Motrin for pain relief. - The increased redness and swelling could be related to an infection that is developing.  I sent an antibiotic to the pharmacy.  Make sure you take full course of antibiotic.  If at any point you notice increased swelling, increased redness, fever, worsening pain, increased difficulty moving her thumb you should go to the emergency department. - Try to follow-up with orthopedics as soon as possible.  You have a condition requiring you to follow up with Orthopedics so please call one of the following office for appointment:   [facility] Address: [address] [place], [place] [address] Phone: [phone]  [facility] [address], [place], [place] [address] Phone: [phone]  [facility] [address], [place], [place] [address] Phone: [phone]     ED Prescriptions     Medication Sig Dispense Auth. Provider   cephALEXin (KEFLEX) 500 MG capsule Take 1 capsule (500 mg total) by mouth 4 (four) times daily for 7  days. 28 capsule Shirlee Latch, PA-C      PDMP not reviewed this encounter.   Shirlee Latch, PA-C 02/06/23 [phone]

## 2023-02-06 NOTE — ED Triage Notes (Signed)
Pt c/o L thumb injury x5 days. States slammed thumb in truck door. Swollen, painful and hot to the touch. - Entered by patient

## 2023-04-16 ENCOUNTER — Emergency Department
Admission: EM | Admit: 2023-04-16 | Discharge: 2023-04-16 | Disposition: A | Discharge status: Home / Self Care | Payer: Medicare Other | Attending: Student in an Organized Health Care Education/Training Program | Admitting: Student in an Organized Health Care Education/Training Program

## 2023-04-16 ENCOUNTER — Emergency Department: Payer: Medicare Other

## 2023-04-16 ENCOUNTER — Other Ambulatory Visit: Payer: Self-pay

## 2023-04-16 DIAGNOSIS — E860 Dehydration: Secondary | ICD-10-CM | POA: Insufficient documentation

## 2023-04-16 DIAGNOSIS — R42 Dizziness and giddiness: Secondary | ICD-10-CM | POA: Diagnosis present

## 2023-04-16 DIAGNOSIS — H81393 Other peripheral vertigo, bilateral: Secondary | ICD-10-CM | POA: Diagnosis not present

## 2023-04-16 LAB — URINALYSIS, ROUTINE W REFLEX MICROSCOPIC
Bacteria, UA: NONE SEEN
Bilirubin Urine: NEGATIVE
Glucose, UA: >=500 mg/dL — AB
Hgb urine dipstick: NEGATIVE
Ketones, ur: 5 mg/dL — AB
Leukocytes,Ua: NEGATIVE
Nitrite: NEGATIVE
Protein, ur: 30 mg/dL — AB
Specific Gravity, Urine: 1.03 (ref 1.005–1.030)
pH: 5 (ref 5.0–8.0)

## 2023-04-16 LAB — BASIC METABOLIC PANEL
Anion gap: 14 (ref 5–15)
BUN: 21 mg/dL (ref 8–23)
CO2: 20 mmol/L — ABNORMAL LOW (ref 22–32)
Calcium: 9.6 mg/dL (ref 8.9–10.3)
Chloride: 105 mmol/L (ref 98–111)
Creatinine, Ser: 1.12 mg/dL (ref 0.61–1.24)
GFR, Estimated: >60 mL/min (ref >60)
Glucose, Bld: 190 mg/dL — ABNORMAL HIGH (ref 70–99)
Potassium: 3.9 mmol/L (ref 3.5–5.1)
Sodium: 139 mmol/L (ref 135–145)

## 2023-04-16 LAB — CBC
HCT: 50.4 % (ref 39.0–52.0)
Hemoglobin: 17.5 g/dL — ABNORMAL HIGH (ref 13.0–17.0)
MCH: 31.1 pg (ref 26.0–34.0)
MCHC: 34.7 g/dL (ref 30.0–36.0)
MCV: 89.7 fL (ref 80.0–100.0)
Platelets: 272 10*3/uL (ref 150–400)
RBC: 5.62 MIL/uL (ref 4.22–5.81)
RDW: 12.4 % (ref 11.5–15.5)
WBC: 7 10*3/uL (ref 4.0–10.5)
nRBC: 0 % (ref 0.0–0.2)

## 2023-04-16 MED ORDER — MECLIZINE HCL 25 MG PO TABS
12.5000 mg | ORAL_TABLET | Freq: Once | ORAL | Status: AC
  Administered 2023-04-16: 12.5 mg via ORAL
  Filled 2023-04-16: qty 1

## 2023-04-16 MED ORDER — MECLIZINE HCL 25 MG PO TABS
12.5000 mg | ORAL_TABLET | Freq: Two times a day (BID) | ORAL | 0 refills | Status: AC | PRN
Start: 2023-04-16 — End: ?

## 2023-04-16 MED ORDER — DIAZEPAM 2 MG PO TABS: 2.0000 mg | ORAL_TABLET | Freq: Three times a day (TID) | ORAL | 0 refills | Status: AC | PRN

## 2023-04-16 NOTE — ED Provider Notes (Signed)
[facility] Provider Note    Event Date/Time   First MD Initiated Contact with Patient 04/16/23 1824     (approximate)   History   Dizziness   HPI  Kenneth Conrad is a 77 y.o. male here with dizziness.  The patient states that for the last day, he has had fairly constant, positional, dizziness.  He states that he has had a history of dizziness earlier this month.  He was switched to [place] at that time and that was related to that.  However, over the last 2 days he has had intermittent positional dizziness.  He states is worse when he moves his head.  He was getting up in the bathroom today when he looked down and began to feel very dizzy.  He felt like the room was spinning around him.  This happened again multiple times that presents for evaluation.  Also got nauseous and vomiting.  Symptoms are now resolved after a wait in the waiting room.  Denies any focal numbness or weakness.  No vision changes.  No headache.  No recent trauma.  Of note, he has a history of bilateral tinnitus for several years.  He has not seen an ENT.     Physical Exam   Triage Vital Signs: ED Triage Vitals  Encounter Vitals Group     BP 04/16/23 1010 131/81     Systolic BP Percentile --      Diastolic BP Percentile --      Pulse Rate 04/16/23 1010 89     Resp 04/16/23 1010 16     Temp 04/16/23 1010 98.1 F (36.7 C)     Temp Source 04/16/23 1010 Oral     SpO2 04/16/23 1010 92 %     Weight 04/16/23 1008 192 lb 0.3 oz (87.1 kg)     Height --      Head Circumference --      Peak Flow --      Pain Score 04/16/23 1008 0     Pain Loc --      Pain Education --      Exclude from Growth Chart --     Most recent vital signs: Vitals:   04/16/23 1010 04/16/23 1506  BP: 131/81 (!) 143/89  Pulse: 89 82  Resp: 16 18  Temp: 98.1 F (36.7 C) (!) 97.5 F (36.4 C)  SpO2: 92% 98%     General: Awake, no distress.  CV:  Good peripheral perfusion.  Regular rate and  rhythm. Resp:  Normal work of breathing.  Abd:  No distention.  Other:  Extraocular movements intact with no nystagmus.  Facial sensation intact bilaterally.  Face is symmetric.  Tongue protrusion midline.  Uvula is midline and palate elevates symmetrically.  Tongue protrusion normal.  5 out of 5 bilateral upper and lower extremity strength.  Normal sensation light touch bilateral upper and lower extremities.  No dysmetria.  No nystagmus.   ED Results / Procedures / Treatments   Labs (all labs ordered are listed, but only abnormal results are displayed) Labs Reviewed  BASIC METABOLIC PANEL - Abnormal; Notable for the following components:      Result Value   CO2 20 (*)    Glucose, Bld 190 (*)    All other components within normal limits  CBC - Abnormal; Notable for the following components:   Hemoglobin 17.5 (*)    All other components within normal limits  URINALYSIS, ROUTINE W REFLEX MICROSCOPIC - Abnormal; Notable  for the following components:   Color, Urine YELLOW (*)    APPearance CLEAR (*)    Glucose, UA >=500 (*)    Ketones, ur 5 (*)    Protein, ur 30 (*)    All other components within normal limits  CBG MONITORING, ED     EKG Normal sinus rhythm, ventricular at 90.  PR 158, QRS 140, QTc 508.  No acute ST elevations or depressions.  No acute evidence of acute ischemia or infarct.   RADIOLOGY MRI brain: No acute abnormality, mild periventricular hyperintensities bilaterally, likely chronic microvascular ischemia   I also independently reviewed and agree with radiologist interpretations.   PROCEDURES:  Critical Care performed: No   MEDICATIONS ORDERED IN ED: Medications  meclizine (ANTIVERT) tablet 12.5 mg (has no administration in time range)     IMPRESSION / MDM / ASSESSMENT AND PLAN / ED COURSE  I reviewed the triage vital signs and the nursing notes.                              Differential diagnosis includes, but is not limited to, peripheral  vertigo, Mnire's disease, central vertigo, mass, lesion, ICH, CVA  Patient's presentation is most consistent with acute presentation with potential threat to life or bodily function.  The patient is on the cardiac monitor to evaluate for evidence of arrhythmia and/or significant heart rate changes   Very pleasant 77 year old male here with positional dizziness/vertigo.  Patient was initially seen in the waiting room and had MRI which is negative.  Vital signs are stable.  Lab work is overall reassuring.  He is mildly dehydrated likely from his vomiting.  Starting p.o. now and has no ongoing vertigo.  No other signs of neurological issue.  No other focal neurological deficits.  Patient has bilateral tinnitus with a history of vertigo previously, will refer him to ENT.  Meclizine given here and will advise him to take an additional dose tomorrow then as needed.  Valium given for severe vertigo.  Return precautions given.   FINAL CLINICAL IMPRESSION(S) / ED DIAGNOSES   Final diagnoses:  Dizziness  Peripheral vertigo of both ears     Rx / DC Orders   ED Discharge Orders          Ordered    meclizine (ANTIVERT) 25 MG tablet  2 times daily PRN        04/16/23 1843    diazepam (VALIUM) 2 MG tablet  Every 8 hours PRN        04/16/23 1843             Note:  This document was prepared using Dragon voice recognition software and may include unintentional dictation errors.   Shaune Pollack, MD 04/16/23 [phone]

## 2023-04-16 NOTE — ED Triage Notes (Signed)
C/O dizziness and vomiting this morning.  Also c/o SOB Onset 0600.  AAOx3.  Skin warm and dry. NAD. MAE equally and strong.  Speech clear.

## 2023-04-16 NOTE — Discharge Instructions (Signed)
As we discussed, I would highly recommend seeing an ENT specialist for your ear ringing and dizziness.  Call the number above to set up an appointment.  Continue your nasal spray and consider adding over-the-counter steroid nasal spray such as Flonase daily.  You are given a dose of meclizine here, and I would recommend taking an additional dose tomorrow morning.  You can start with 12.5 mg.  If you remain dizzy, the maximum dose is 25 mg every 12 hours.  This can make you sleepy so do not drive.  Also do not drive until vertigo has resolved for 24 hours.  If vertigo is severe despite this, you can take an additional dose of Valium 2 mg.  Drink plenty of fluids.

## 2024-01-25 ENCOUNTER — Encounter: Payer: Self-pay | Admitting: *Deleted

## 2024-02-06 ENCOUNTER — Ambulatory Visit
Admission: RE | Admit: 2024-02-06 | Discharge: 2024-02-06 | Disposition: A | Discharge status: Home / Self Care | Attending: Gastroenterology | Admitting: Gastroenterology

## 2024-02-06 ENCOUNTER — Ambulatory Visit

## 2024-02-06 ENCOUNTER — Encounter
Admission: RE | Disposition: A | Discharge status: Home / Self Care | Payer: Self-pay | Source: Home / Self Care | Attending: Gastroenterology

## 2024-02-06 DIAGNOSIS — I10 Essential (primary) hypertension: Secondary | ICD-10-CM | POA: Diagnosis not present

## 2024-02-06 DIAGNOSIS — Z7984 Long term (current) use of oral hypoglycemic drugs: Secondary | ICD-10-CM | POA: Diagnosis not present

## 2024-02-06 DIAGNOSIS — E119 Type 2 diabetes mellitus without complications: Secondary | ICD-10-CM | POA: Insufficient documentation

## 2024-02-06 DIAGNOSIS — Z1211 Encounter for screening for malignant neoplasm of colon: Secondary | ICD-10-CM | POA: Diagnosis present

## 2024-02-06 DIAGNOSIS — K640 First degree hemorrhoids: Secondary | ICD-10-CM | POA: Insufficient documentation

## 2024-02-06 DIAGNOSIS — Z79899 Other long term (current) drug therapy: Secondary | ICD-10-CM | POA: Insufficient documentation

## 2024-02-06 DIAGNOSIS — G4733 Obstructive sleep apnea (adult) (pediatric): Secondary | ICD-10-CM | POA: Diagnosis not present

## 2024-02-06 HISTORY — DX: Presence of other cardiac implants and grafts: Z95.818

## 2024-02-06 HISTORY — PX: COLONOSCOPY: SHX5424

## 2024-02-06 LAB — GLUCOSE, CAPILLARY: Glucose-Capillary: 119 mg/dL — ABNORMAL HIGH (ref 70–99)

## 2024-02-06 SURGERY — COLONOSCOPY
Anesthesia: General

## 2024-02-06 MED ORDER — PROPOFOL 500 MG/50ML IV EMUL
INTRAVENOUS | Status: DC | PRN
  Administered 2024-02-06: 150 ug/kg/min via INTRAVENOUS

## 2024-02-06 MED ORDER — LIDOCAINE HCL (CARDIAC) PF 100 MG/5ML IV SOSY
PREFILLED_SYRINGE | INTRAVENOUS | Status: DC | PRN
  Administered 2024-02-06: 60 mg via INTRAVENOUS

## 2024-02-06 MED ORDER — SODIUM CHLORIDE 0.9 % IV SOLN: INTRAVENOUS | Status: DC

## 2024-02-06 MED ORDER — PROPOFOL 10 MG/ML IV BOLUS
INTRAVENOUS | Status: DC | PRN
  Administered 2024-02-06: 70 mg via INTRAVENOUS

## 2024-02-06 MED ORDER — PROPOFOL 1000 MG/100ML IV EMUL
INTRAVENOUS | Status: AC
  Filled 2024-02-06: qty 100

## 2024-02-06 MED ORDER — LIDOCAINE HCL (PF) 2 % IJ SOLN
INTRAMUSCULAR | Status: AC
  Filled 2024-02-06: qty 5

## 2024-02-06 NOTE — Anesthesia Postprocedure Evaluation (Signed)
 Anesthesia Post Note  Patient: Kenneth Conrad  Procedure(s) Performed: COLONOSCOPY  Patient location during evaluation: PACU Anesthesia Type: General Level of consciousness: awake and alert Pain management: pain level controlled Vital Signs Assessment: post-procedure vital signs reviewed and stable Respiratory status: spontaneous breathing, nonlabored ventilation, respiratory function stable and patient connected to nasal cannula oxygen Cardiovascular status: blood pressure returned to baseline and stable Postop Assessment: no apparent nausea or vomiting Anesthetic complications: no   No notable events documented.   Last Vitals:  Vitals:   02/06/24 1004 02/06/24 1014  BP: (!) 110/58 (!) 141/78  Pulse: 63 (!) 59  Resp: 16 17  Temp:    SpO2: 99% 99%    Last Pain:  Vitals:   02/06/24 1014  TempSrc:   PainSc: 0-No pain                 Lynwood KANDICE Clause

## 2024-02-06 NOTE — Transfer of Care (Signed)
 Immediate Anesthesia Transfer of Care Note  Patient: Kenneth Conrad  Procedure(s) Performed: COLONOSCOPY  Patient Location: Endoscopy Unit  Anesthesia Type:General  Level of Consciousness: sedated  Airway & Oxygen Therapy: Patient Spontanous Breathing  Post-op Assessment: Report given to RN and Post -op Vital signs reviewed and stable  Post vital signs: Reviewed and stable  Last Vitals:  Vitals Value Taken Time  BP 97/57 0959  Temp  0959  Pulse 61 0959  Resp 15 0959  SpO2 100% 0959    Last Pain:  Vitals:   02/06/24 0847  TempSrc: Temporal         Complications: No notable events documented.

## 2024-02-06 NOTE — Interval H&P Note (Signed)
 History and Physical Interval Note:  02/06/2024 9:33 AM  Kenneth Conrad  has presented today for surgery, with the diagnosis of HX OF ADENOMATOUS POLYP OF COLON.  The various methods of treatment have been discussed with the patient and family. After consideration of risks, benefits and other options for treatment, the patient has consented to  Procedure(s) with comments: COLONOSCOPY (N/A) - DM as a surgical intervention.  The patient's history has been reviewed, patient examined, no change in status, stable for surgery.  I have reviewed the patient's chart and labs.  Questions were answered to the patient's satisfaction.     Ole [name]  Ok to proceed with colonoscopy

## 2024-02-06 NOTE — H&P (Signed)
 Outpatient short stay form Pre-procedure 02/06/2024  Kenneth [name], MD  Primary Physician: Mylinda Lenis, MD  Reason for visit:  Surveillance  History of present illness:    78 y/o gentleman with history of hypertension and OSA here for surveillance colonoscopy. Last colonoscopy in 2022 with small Ta's. No blood thinners. No family history of colon cancer. No significant abdominal surgeries.    Current Facility-Administered Medications:    0.9 %  sodium chloride  infusion, , Intravenous, Continuous, Adessa Primiano, Kenneth ONEIDA, MD, Last Rate: 20 mL/hr at 02/06/24 0901, New Bag at 02/06/24 0901  Medications Prior to Admission  Medication Sig Dispense Refill Last Dose/Taking   amLODipine (NORVASC) 2.5 MG tablet Take 10 mg by mouth daily.   02/06/2024 Morning   metFORMIN (GLUCOPHAGE) 500 MG tablet Take by mouth 2 (two) times daily with a meal.   02/05/2024   aspirin 81 MG tablet Take 81 mg by mouth daily.      diazepam  (VALIUM ) 2 MG tablet Take 1 tablet (2 mg total) by mouth every 8 (eight) hours as needed (vertigo, severe). 12 tablet 0    ergocalciferol (VITAMIN D2) 50000 units capsule Take 50,000 Units by mouth every 30 (thirty) days.      losartan (COZAAR) 100 MG tablet Take 100 mg by mouth daily.      meclizine  (ANTIVERT ) 25 MG tablet Take 0.5-1 tablets (12.5-25 mg total) by mouth 2 (two) times daily as needed for dizziness. 20 tablet 0    pravastatin (PRAVACHOL) 20 MG tablet Take 20 mg by mouth daily.      rOPINIRole (REQUIP) 0.5 MG tablet Take 0.5 mg by mouth 3 (three) times daily.      triamcinolone cream (KENALOG) 0.5 % Apply 1 application topically 2 (two) times daily as needed.      vitamin B-12 (CYANOCOBALAMIN) 1000 MCG tablet Take 1,000 mcg by mouth daily.        Allergies  Allergen Reactions   Hctz [Hydrochlorothiazide]    Hydralazine    Reserpine      Past Medical History:  Diagnosis Date   Diabetes mellitus without complication (HCC)    Eczema of external ear    ED  (erectile dysfunction)    Hyperlipidemia    Hypertension    Hyperuricemia    Hypogonadism in male    Implantable loop recorder present    OSA on CPAP     Review of systems:  Otherwise negative.    Physical Exam  Gen: Alert, oriented. Appears stated age.  HEENT: PERRLA. Lungs: No respiratory distress CV: RRR Abd: soft, benign, no masses Ext: No edema    Planned procedures: Proceed with colonoscopy. The patient understands the nature of the planned procedure, indications, risks, alternatives and potential complications including but not limited to bleeding, infection, perforation, damage to internal organs and possible oversedation/side effects from anesthesia. The patient agrees and gives consent to proceed.  Please refer to procedure notes for findings, recommendations and patient disposition/instructions.     Kenneth [name], MD [facility]

## 2024-02-06 NOTE — Op Note (Signed)
 [facility] Patient Name: Kenneth Conrad Procedure Date: 02/06/2024 9:25 AM MRN: [id number] Account #: [id number] Date of Birth: November 11, 1945 Admit Type: Outpatient Age: 78 Room: [facility] ENDO ROOM 1 Gender: Male Note Status: Finalized Instrument Name: Colon Scope [id number] Procedure:             Colonoscopy Indications:           Surveillance: Personal history of adenomatous polyps                         on last colonoscopy 3 years ago Providers:             Ole Schick MD, MD Referring MD:          Alm SAILOR. Mylinda, MD (Referring MD) Medicines:             Monitored Anesthesia Care Complications:         No immediate complications. Procedure:             Pre-Anesthesia Assessment:                        - Prior to the procedure, a History and Physical was                         performed, and patient medications and allergies were                         reviewed. The patient is competent. The risks and                         benefits of the procedure and the sedation options and                         risks were discussed with the patient. All questions                         were answered and informed consent was obtained.                         Patient identification and proposed procedure were                         verified by the physician, the nurse, the                         anesthesiologist, the anesthetist and the technician                         in the endoscopy suite. Mental Status Examination:                         alert and oriented. Airway Examination: normal                         oropharyngeal airway and neck mobility. Respiratory                         Examination: clear to auscultation. CV Examination:  normal. Prophylactic Antibiotics: The patient does not                         require prophylactic antibiotics. Prior                         Anticoagulants: The patient has taken no  anticoagulant                         or antiplatelet agents. ASA Grade Assessment: II - A                         patient with mild systemic disease. After reviewing                         the risks and benefits, the patient was deemed in                         satisfactory condition to undergo the procedure. The                         anesthesia plan was to use monitored anesthesia care                         (MAC). Immediately prior to administration of                         medications, the patient was re-assessed for adequacy                         to receive sedatives. The heart rate, respiratory                         rate, oxygen saturations, blood pressure, adequacy of                         pulmonary ventilation, and response to care were                         monitored throughout the procedure. The physical                         status of the patient was re-assessed after the                         procedure.                        After obtaining informed consent, the colonoscope was                         passed under direct vision. Throughout the procedure,                         the patient's blood pressure, pulse, and oxygen                         saturations were monitored continuously. The was  introduced through the anus and advanced to the the                         terminal ileum, with identification of the appendiceal                         orifice and IC valve. The colonoscopy was performed                         without difficulty. The patient tolerated the                         procedure well. The quality of the bowel preparation                         was good. The terminal ileum, ileocecal valve,                         appendiceal orifice, and rectum were photographed. Findings:      The perianal and digital rectal examinations were normal.      The terminal ileum appeared normal.      Internal hemorrhoids were  found during retroflexion. The hemorrhoids       were Grade I (internal hemorrhoids that do not prolapse).      The exam was otherwise without abnormality on direct and retroflexion       views. Impression:            - The examined portion of the ileum was normal.                        - Internal hemorrhoids.                        - The examination was otherwise normal on direct and                         retroflexion views.                        - No specimens collected. Recommendation:        - Discharge patient to home.                        - Resume previous diet.                        - Continue present medications.                        - Repeat colonoscopy is not recommended due to current                         age (41 years or older) for surveillance.                        - Return to referring physician as previously                         scheduled. Procedure Code(s):     --- Professional ---  G0105, Colorectal cancer screening; colonoscopy on                         individual at high risk Diagnosis Code(s):     --- Professional ---                        Z86.010, Personal history of colonic polyps                        K64.0, First degree hemorrhoids CPT copyright 2022 [employer]. All rights reserved. The codes documented in this report are preliminary and upon coder review may  be revised to meet current compliance requirements. Ole Schick MD, MD 02/06/2024 9:53:43 AM Number of Addenda: 0 Note Initiated On: 02/06/2024 9:25 AM Scope Withdrawal Time: 0 hours 6 minutes 58 seconds  Total Procedure Duration: 0 hours 11 minutes 19 seconds  Estimated Blood Loss:  Estimated blood loss: none.      [facility]

## 2024-02-06 NOTE — Anesthesia Preprocedure Evaluation (Signed)
 Anesthesia Evaluation  Patient identified by MRN, date of birth, ID band Patient awake    Reviewed: Allergy & Precautions, H&P , NPO status , Patient's Chart, lab work & pertinent test results, reviewed documented beta blocker date and time   Airway Mallampati: II   Neck ROM: full    Dental  (+) Poor Dentition   Pulmonary sleep apnea    Pulmonary exam normal        Cardiovascular Exercise Tolerance: Poor hypertension, On Medications negative cardio ROS Normal cardiovascular exam Rhythm:regular Rate:Normal     Neuro/Psych negative neurological ROS  negative psych ROS   GI/Hepatic negative GI ROS, Neg liver ROS,,,  Endo/Other  negative endocrine ROSdiabetes, Well Controlled    Renal/GU negative Renal ROS  negative genitourinary   Musculoskeletal   Abdominal   Peds  Hematology negative hematology ROS (+)   Anesthesia Other Findings Past Medical History: No date: Diabetes mellitus without complication (HCC) No date: Eczema of external ear No date: ED (erectile dysfunction) No date: Hyperlipidemia No date: Hypertension No date: Hyperuricemia No date: Hypogonadism in male No date: Implantable loop recorder present No date: OSA on CPAP Past Surgical History: 06/01/2015: COLONOSCOPY WITH PROPOFOL ; N/A     Comment:  Procedure: COLONOSCOPY WITH PROPOFOL ;  Surgeon: Donnice Vaughn Manes, MD;  Location: [facility] ENDOSCOPY;  Service:               Endoscopy;  Laterality: N/A; 09/22/2020: COLONOSCOPY WITH PROPOFOL ; N/A     Comment:  Procedure: COLONOSCOPY WITH PROPOFOL ;  Surgeon:               Maryruth Ole DASEN, MD;  Location: [facility] ENDOSCOPY;                Service: Endoscopy;  Laterality: N/A; No date: HYPOSPADIAS CORRECTION BMI    Body Mass Index: 28.66 kg/m     Reproductive/Obstetrics negative OB ROS                              Anesthesia Physical Anesthesia Plan  ASA:  3  Anesthesia Plan: General   Post-op Pain Management:    Induction:   PONV Risk Score and Plan:   Airway Management Planned:   Additional Equipment:   Intra-op Plan:   Post-operative Plan:   Informed Consent: I have reviewed the patients History and Physical, chart, labs and discussed the procedure including the risks, benefits and alternatives for the proposed anesthesia with the patient or authorized representative who has indicated his/her understanding and acceptance.     Dental Advisory Given  Plan Discussed with: CRNA  Anesthesia Plan Comments:         Anesthesia Quick Evaluation
